# Patient Record
Sex: Female | Born: 2000
Health system: Southern US, Community
[De-identification: ages and names within clinical notes are randomized; demographics above are authoritative.]

## PROBLEM LIST (undated history)

## (undated) DIAGNOSIS — F909 Attention-deficit hyperactivity disorder, unspecified type: Secondary | ICD-10-CM

## (undated) HISTORY — DX: Attention-deficit hyperactivity disorder, unspecified type: F90.9

---
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
## 2021-06-04 DIAGNOSIS — J019 Acute sinusitis, unspecified: Secondary | ICD-10-CM | POA: Diagnosis not present

## 2022-03-01 DIAGNOSIS — F5101 Primary insomnia: Secondary | ICD-10-CM | POA: Diagnosis not present

## 2022-08-02 ENCOUNTER — Emergency Department (HOSPITAL_BASED_OUTPATIENT_CLINIC_OR_DEPARTMENT_OTHER)
Admission: EM | Admit: 2022-08-02 | Discharge: 2022-08-03 | Disposition: A | Discharge status: Home / Self Care | Payer: BC Managed Care – PPO | Attending: Emergency Medicine | Admitting: Emergency Medicine

## 2022-08-02 ENCOUNTER — Other Ambulatory Visit: Payer: Self-pay

## 2022-08-02 ENCOUNTER — Emergency Department (HOSPITAL_BASED_OUTPATIENT_CLINIC_OR_DEPARTMENT_OTHER): Payer: BC Managed Care – PPO | Admitting: Radiology

## 2022-08-02 ENCOUNTER — Encounter (HOSPITAL_BASED_OUTPATIENT_CLINIC_OR_DEPARTMENT_OTHER): Payer: Self-pay | Admitting: Emergency Medicine

## 2022-08-02 DIAGNOSIS — M545 Low back pain, unspecified: Secondary | ICD-10-CM | POA: Diagnosis not present

## 2022-08-02 DIAGNOSIS — R109 Unspecified abdominal pain: Secondary | ICD-10-CM | POA: Insufficient documentation

## 2022-08-02 DIAGNOSIS — M25552 Pain in left hip: Secondary | ICD-10-CM | POA: Diagnosis not present

## 2022-08-02 DIAGNOSIS — M25551 Pain in right hip: Secondary | ICD-10-CM | POA: Insufficient documentation

## 2022-08-02 LAB — CBC
HCT: 39.9 % (ref 36.0–46.0)
Hemoglobin: 14.2 g/dL (ref 12.0–15.0)
MCH: 31.2 pg (ref 26.0–34.0)
MCHC: 35.6 g/dL (ref 30.0–36.0)
MCV: 87.7 fL (ref 80.0–100.0)
Platelets: 256 10*3/uL (ref 150–400)
RBC: 4.55 MIL/uL (ref 3.87–5.11)
RDW: 12.3 % (ref 11.5–15.5)
WBC: 10.3 10*3/uL (ref 4.0–10.5)
nRBC: 0 % (ref 0.0–0.2)

## 2022-08-02 LAB — URINALYSIS, ROUTINE W REFLEX MICROSCOPIC
Bilirubin Urine: NEGATIVE
Glucose, UA: NEGATIVE mg/dL
Hgb urine dipstick: NEGATIVE
Ketones, ur: NEGATIVE mg/dL
Leukocytes,Ua: NEGATIVE
Nitrite: NEGATIVE
Protein, ur: NEGATIVE mg/dL
Specific Gravity, Urine: <1.005 — ABNORMAL LOW (ref 1.005–1.030)
pH: 7 (ref 5.0–8.0)

## 2022-08-02 LAB — COMPREHENSIVE METABOLIC PANEL
ALT: 10 U/L (ref 0–44)
AST: 16 U/L (ref 15–41)
Albumin: 4.6 g/dL (ref 3.5–5.0)
Alkaline Phosphatase: 67 U/L (ref 38–126)
Anion gap: 8 (ref 5–15)
BUN: 8 mg/dL (ref 6–20)
CO2: 26 mmol/L (ref 22–32)
Calcium: 9.6 mg/dL (ref 8.9–10.3)
Chloride: 104 mmol/L (ref 98–111)
Creatinine, Ser: 0.57 mg/dL (ref 0.44–1.00)
GFR, Estimated: >60 mL/min (ref >60)
Glucose, Bld: 88 mg/dL (ref 70–99)
Potassium: 3.9 mmol/L (ref 3.5–5.1)
Sodium: 138 mmol/L (ref 135–145)
Total Bilirubin: 0.3 mg/dL (ref 0.3–1.2)
Total Protein: 7.1 g/dL (ref 6.5–8.1)

## 2022-08-02 LAB — LIPASE, BLOOD: Lipase: 22 U/L (ref 11–51)

## 2022-08-02 LAB — PREGNANCY, URINE: Preg Test, Ur: NEGATIVE

## 2022-08-02 NOTE — Discharge Instructions (Signed)
You were seen here today for evaluation of your bilateral hip pain for the past few months.  Your x-rays were normal.  Your lab work was unremarkable.  I have included information for orthopedic provider to this discharge paperwork.  Please call to schedule an appointment.  Additionally, I would like for you to premedicate yourself with 600 mg of ibuprofen and 1000 mg of Tylenol before work and you can take this every 6 hours as needed for pain. I would also try some out some new footwear as well. If you have any concerns, new or worsening symptoms, please return to the nearest Emergency Department for reevaluation.  Contact a doctor if: You have pain that gets worse and does not get better with medicine. Your joint pain does not get better in 3 days. You have more bruising or swelling. You have a fever. You lose 10 lb (4.5 kg) or more without trying. Get help right away if: You cannot move the joint. Your fingers or toes tingle, become numb. or get cold and blue. You have a fever along with a joint that is red, warm, and swollen.

## 2022-08-02 NOTE — ED Triage Notes (Signed)
Lower abdominal / pelvic  pain x 4 months. Painful to walk, getting worse Sharp pain worse after walking

## 2022-08-02 NOTE — ED Provider Notes (Signed)
  [facility] Provider Note   CSN: [id number] Arrival date & time: 08/02/22  1855     History {Add pertinent medical, surgical, social history, OB history to HPI:1} Chief Complaint  Patient presents with   Abdominal Pain    Rebecca Flowers is a 22 y.o. female.   Abdominal Pain      Home Medications Prior to Admission medications   Medication Sig Start Date End Date Taking? Authorizing Provider  cloNIDine (CATAPRES) 0.1 MG tablet Take 1 tablet (0.1 mg total) by mouth at bedtime. 05/13/16   Paretta-Leahey, Haze Boyden, NP  EVEKEO 10 MG TABS Take 10 mg by mouth daily with breakfast. Do not fill until 04/02/16. 02/01/16   Paretta-Leahey, Haze Boyden, NP      Allergies    Patient has no known allergies.    Review of Systems   Review of Systems  Gastrointestinal:  Positive for abdominal pain.    Physical Exam Updated Vital Signs BP 133/73   Pulse (!) 55   Temp 98.7 F (37.1 C) (Oral)   Resp 18   LMP 07/02/2022   SpO2 99%  Physical Exam  ED Results / Procedures / Treatments   Labs (all labs ordered are listed, but only abnormal results are displayed) Labs Reviewed  URINALYSIS, ROUTINE W REFLEX MICROSCOPIC - Abnormal; Notable for the following components:      Result Value   Color, Urine COLORLESS (*)    Specific Gravity, Urine <1.005 (*)    All other components within normal limits  WET PREP, GENITAL  LIPASE, BLOOD  COMPREHENSIVE METABOLIC PANEL  CBC  PREGNANCY, URINE    EKG None  Radiology DG HIPS BILAT WITH PELVIS MIN 5 VIEWS  Result Date: 08/02/2022 CLINICAL DATA:  Sharp pain EXAM: DG HIP (WITH OR WITHOUT PELVIS) 5+V BILAT COMPARISON:  None Available. FINDINGS: There is no evidence of hip fracture or dislocation. There is no evidence of arthropathy or other focal bone abnormality. IMPRESSION: Negative. Electronically Signed   By: Donavan Foil M.D.   On: 08/02/2022 22:16    Procedures Procedures  {Document cardiac  monitor, telemetry assessment procedure when appropriate:1}  Medications Ordered in ED Medications - No data to display  ED Course/ Medical Decision Making/ A&P   {   Click here for ABCD2, HEART and other calculatorsREFRESH Note before signing :1}                          Medical Decision Making Amount and/or Complexity of Data Reviewed Labs: ordered. Radiology: ordered.   ***  {Document critical care time when appropriate:1} {Document review of labs and clinical decision tools ie heart score, Chads2Vasc2 etc:1}  {Document your independent review of radiology images, and any outside records:1} {Document your discussion with family members, caretakers, and with consultants:1} {Document social determinants of health affecting pt's care:1} {Document your decision making why or why not admission, treatments were needed:1} Final Clinical Impression(s) / ED Diagnoses Final diagnoses:  None    Rx / DC Orders ED Discharge Orders     None

## 2022-08-02 NOTE — ED Notes (Signed)
Updated patient on reason for wait

## 2022-08-03 MED ORDER — IBUPROFEN 400 MG PO TABS
600.0000 mg | ORAL_TABLET | Freq: Once | ORAL | Status: AC
  Administered 2022-08-03: 600 mg via ORAL
  Filled 2022-08-03: qty 1

## 2022-08-03 MED ORDER — ACETAMINOPHEN 500 MG PO TABS
1000.0000 mg | ORAL_TABLET | Freq: Once | ORAL | Status: AC
  Administered 2022-08-03: 1000 mg via ORAL
  Filled 2022-08-03: qty 2

## 2022-08-20 DIAGNOSIS — M25551 Pain in right hip: Secondary | ICD-10-CM | POA: Diagnosis not present

## 2022-08-20 DIAGNOSIS — M25852 Other specified joint disorders, left hip: Secondary | ICD-10-CM | POA: Diagnosis not present

## 2022-08-20 DIAGNOSIS — M25552 Pain in left hip: Secondary | ICD-10-CM | POA: Diagnosis not present

## 2022-08-28 DIAGNOSIS — M25852 Other specified joint disorders, left hip: Secondary | ICD-10-CM | POA: Diagnosis not present

## 2022-12-20 DIAGNOSIS — T7421XA Adult sexual abuse, confirmed, initial encounter: Secondary | ICD-10-CM | POA: Diagnosis not present

## 2022-12-23 DIAGNOSIS — J039 Acute tonsillitis, unspecified: Secondary | ICD-10-CM | POA: Diagnosis not present

## 2022-12-23 DIAGNOSIS — R509 Fever, unspecified: Secondary | ICD-10-CM | POA: Diagnosis not present

## 2023-01-01 DIAGNOSIS — F5101 Primary insomnia: Secondary | ICD-10-CM | POA: Diagnosis not present

## 2023-01-01 DIAGNOSIS — J020 Streptococcal pharyngitis: Secondary | ICD-10-CM | POA: Diagnosis not present

## 2023-01-01 DIAGNOSIS — F411 Generalized anxiety disorder: Secondary | ICD-10-CM | POA: Diagnosis not present

## 2023-01-01 DIAGNOSIS — N939 Abnormal uterine and vaginal bleeding, unspecified: Secondary | ICD-10-CM | POA: Diagnosis not present

## 2023-01-01 DIAGNOSIS — T7421XD Adult sexual abuse, confirmed, subsequent encounter: Secondary | ICD-10-CM | POA: Diagnosis not present

## 2023-01-15 DIAGNOSIS — J029 Acute pharyngitis, unspecified: Secondary | ICD-10-CM | POA: Diagnosis not present

## 2023-04-01 DIAGNOSIS — Z20822 Contact with and (suspected) exposure to covid-19: Secondary | ICD-10-CM | POA: Diagnosis not present

## 2023-04-01 DIAGNOSIS — J069 Acute upper respiratory infection, unspecified: Secondary | ICD-10-CM | POA: Diagnosis not present

## 2023-04-01 DIAGNOSIS — J019 Acute sinusitis, unspecified: Secondary | ICD-10-CM | POA: Diagnosis not present

## 2023-05-10 ENCOUNTER — Emergency Department (HOSPITAL_BASED_OUTPATIENT_CLINIC_OR_DEPARTMENT_OTHER)
Admission: EM | Admit: 2023-05-10 | Discharge: 2023-05-10 | Disposition: A | Discharge status: Home / Self Care | Payer: BC Managed Care – PPO | Attending: Emergency Medicine | Admitting: Emergency Medicine

## 2023-05-10 ENCOUNTER — Other Ambulatory Visit: Payer: Self-pay

## 2023-05-10 ENCOUNTER — Emergency Department (HOSPITAL_BASED_OUTPATIENT_CLINIC_OR_DEPARTMENT_OTHER): Payer: BC Managed Care – PPO

## 2023-05-10 ENCOUNTER — Encounter (HOSPITAL_BASED_OUTPATIENT_CLINIC_OR_DEPARTMENT_OTHER): Payer: Self-pay | Admitting: Emergency Medicine

## 2023-05-10 DIAGNOSIS — Y92410 Unspecified street and highway as the place of occurrence of the external cause: Secondary | ICD-10-CM | POA: Insufficient documentation

## 2023-05-10 DIAGNOSIS — S4992XA Unspecified injury of left shoulder and upper arm, initial encounter: Secondary | ICD-10-CM | POA: Diagnosis not present

## 2023-05-10 DIAGNOSIS — S0990XA Unspecified injury of head, initial encounter: Secondary | ICD-10-CM | POA: Diagnosis not present

## 2023-05-10 DIAGNOSIS — R079 Chest pain, unspecified: Secondary | ICD-10-CM | POA: Diagnosis not present

## 2023-05-10 DIAGNOSIS — S40212A Abrasion of left shoulder, initial encounter: Secondary | ICD-10-CM | POA: Insufficient documentation

## 2023-05-10 DIAGNOSIS — R55 Syncope and collapse: Secondary | ICD-10-CM | POA: Diagnosis not present

## 2023-05-10 DIAGNOSIS — R569 Unspecified convulsions: Secondary | ICD-10-CM | POA: Insufficient documentation

## 2023-05-10 DIAGNOSIS — R4020 Unspecified coma: Secondary | ICD-10-CM | POA: Diagnosis not present

## 2023-05-10 LAB — ETHANOL: Alcohol, Ethyl (B): <10 mg/dL (ref <10)

## 2023-05-10 LAB — MAGNESIUM: Magnesium: 2.1 mg/dL (ref 1.7–2.4)

## 2023-05-10 LAB — COMPREHENSIVE METABOLIC PANEL
ALT: 12 U/L (ref 0–44)
AST: 20 U/L (ref 15–41)
Albumin: 4.5 g/dL (ref 3.5–5.0)
Alkaline Phosphatase: 69 U/L (ref 38–126)
Anion gap: 9 (ref 5–15)
BUN: 9 mg/dL (ref 6–20)
CO2: 25 mmol/L (ref 22–32)
Calcium: 9.3 mg/dL (ref 8.9–10.3)
Chloride: 106 mmol/L (ref 98–111)
Creatinine, Ser: 0.62 mg/dL (ref 0.44–1.00)
GFR, Estimated: >60 mL/min (ref >60)
Glucose, Bld: 80 mg/dL (ref 70–99)
Potassium: 3.8 mmol/L (ref 3.5–5.1)
Sodium: 140 mmol/L (ref 135–145)
Total Bilirubin: 0.3 mg/dL (ref <1.2)
Total Protein: 7.1 g/dL (ref 6.5–8.1)

## 2023-05-10 LAB — URINALYSIS, W/ REFLEX TO CULTURE (INFECTION SUSPECTED)
Bilirubin Urine: NEGATIVE
Glucose, UA: NEGATIVE mg/dL
Ketones, ur: NEGATIVE mg/dL
Leukocytes,Ua: NEGATIVE
Nitrite: NEGATIVE
Protein, ur: NEGATIVE mg/dL
RBC / HPF: >50 RBC/hpf (ref 0–5)
Specific Gravity, Urine: 1.01 (ref 1.005–1.030)
pH: 5.5 (ref 5.0–8.0)

## 2023-05-10 LAB — CBC WITH DIFFERENTIAL/PLATELET
Abs Immature Granulocytes: 0.04 10*3/uL (ref 0.00–0.07)
Basophils Absolute: 0.1 10*3/uL (ref 0.0–0.1)
Basophils Relative: 1 %
Eosinophils Absolute: 0.3 10*3/uL (ref 0.0–0.5)
Eosinophils Relative: 2 %
HCT: 39.3 % (ref 36.0–46.0)
Hemoglobin: 14 g/dL (ref 12.0–15.0)
Immature Granulocytes: 0 %
Lymphocytes Relative: 19 %
Lymphs Abs: 2.2 10*3/uL (ref 0.7–4.0)
MCH: 31.3 pg (ref 26.0–34.0)
MCHC: 35.6 g/dL (ref 30.0–36.0)
MCV: 87.9 fL (ref 80.0–100.0)
Monocytes Absolute: 0.8 10*3/uL (ref 0.1–1.0)
Monocytes Relative: 7 %
Neutro Abs: 8.5 10*3/uL — ABNORMAL HIGH (ref 1.7–7.7)
Neutrophils Relative %: 71 %
Platelets: 305 10*3/uL (ref 150–400)
RBC: 4.47 MIL/uL (ref 3.87–5.11)
RDW: 12.3 % (ref 11.5–15.5)
WBC: 11.6 10*3/uL — ABNORMAL HIGH (ref 4.0–10.5)
nRBC: 0 % (ref 0.0–0.2)

## 2023-05-10 LAB — RAPID URINE DRUG SCREEN, HOSP PERFORMED
Amphetamines: NOT DETECTED
Barbiturates: NOT DETECTED
Benzodiazepines: NOT DETECTED
Cocaine: NOT DETECTED
Opiates: NOT DETECTED
Tetrahydrocannabinol: POSITIVE — AB

## 2023-05-10 LAB — HCG, SERUM, QUALITATIVE: Preg, Serum: NEGATIVE

## 2023-05-10 MED ORDER — LEVETIRACETAM 500 MG PO TABS: 500.0000 mg | ORAL_TABLET | Freq: Two times a day (BID) | ORAL | 1 refills | Status: DC

## 2023-05-10 MED ORDER — LEVETIRACETAM IN NACL 1000 MG/100ML IV SOLN
1000.0000 mg | Freq: Once | INTRAVENOUS | Status: AC
Start: 2023-05-10 — End: 2023-05-10
  Administered 2023-05-10: 1000 mg via INTRAVENOUS
  Filled 2023-05-10: qty 100

## 2023-05-10 MED ORDER — LEVETIRACETAM IN NACL 500 MG/100ML IV SOLN
500.0000 mg | Freq: Once | INTRAVENOUS | Status: AC
  Administered 2023-05-10: 500 mg via INTRAVENOUS
  Filled 2023-05-10: qty 100

## 2023-05-10 MED ORDER — SODIUM CHLORIDE 0.9 % IV SOLN: 20.0000 mg/kg | Freq: Once | INTRAVENOUS | Status: DC
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
## 2023-05-14 ENCOUNTER — Ambulatory Visit (INDEPENDENT_AMBULATORY_CARE_PROVIDER_SITE_OTHER): Payer: BC Managed Care – PPO | Admitting: Neurology

## 2023-05-14 ENCOUNTER — Encounter: Payer: Self-pay | Admitting: Neurology

## 2023-05-14 VITALS — BP 122/86 | HR 77 | Ht 63.0 in | Wt 162.5 lb

## 2023-05-14 DIAGNOSIS — R569 Unspecified convulsions: Secondary | ICD-10-CM | POA: Diagnosis not present

## 2023-05-14 DIAGNOSIS — Z5181 Encounter for therapeutic drug level monitoring: Secondary | ICD-10-CM

## 2023-05-14 DIAGNOSIS — T7421XD Adult sexual abuse, confirmed, subsequent encounter: Secondary | ICD-10-CM

## 2023-05-14 MED ORDER — LEVETIRACETAM 500 MG PO TABS: 500.0000 mg | ORAL_TABLET | Freq: Two times a day (BID) | ORAL | 3 refills | Status: DC
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
## 2023-05-16 LAB — LEVETIRACETAM LEVEL: Levetiracetam Lvl: 19.6 ug/mL (ref 10.0–40.0)

## 2023-05-19 ENCOUNTER — Telehealth: Payer: Self-pay

## 2023-05-19 NOTE — Progress Notes (Signed)
Please call and advise the patient that the recent Keppra level was within normal limits. No further action is required on these tests at this time. Please remind patient to keep any upcoming appointments or tests and to call us with any interim questions, concerns, problems or updates. Thanks,   Tonye Tancredi, MD    

## 2023-05-19 NOTE — Telephone Encounter (Signed)
Call to patient, her mother answered. Left message to return call. I added her to Dr. Teresa Coombs schedule on 08/15/23 at 9:45.   If patient returns call, please confirm she is aware of appointment and able to make it.

## 2023-05-20 ENCOUNTER — Telehealth: Payer: Self-pay | Admitting: Neurology

## 2023-05-20 NOTE — Telephone Encounter (Signed)
Yes, that is fine. Please continue the Keppra as directed.

## 2023-05-20 NOTE — Telephone Encounter (Addendum)
I talked to the patient's mom and scheduled her MRI and she said that the patient is going to the dentist tomorrow to get cavities filled and they wanted to make sure she will be ok to sit there because of her seizures. Please call her back

## 2023-05-20 NOTE — Telephone Encounter (Signed)
Returned call to mother and advised per Dr. Teresa Coombs, okay to move forward with dental appointment and continue medications as prescribed. Advised if patient wasn't feeling well or up to it, she could always reschedule dental appointment. Mother appreciative of call and verbalized  understanding

## 2023-05-23 ENCOUNTER — Other Ambulatory Visit: Payer: Self-pay

## 2023-05-23 ENCOUNTER — Emergency Department (HOSPITAL_BASED_OUTPATIENT_CLINIC_OR_DEPARTMENT_OTHER)
Admission: EM | Admit: 2023-05-23 | Discharge: 2023-05-23 | Disposition: A | Discharge status: Home / Self Care | Payer: BC Managed Care – PPO | Attending: Emergency Medicine | Admitting: Emergency Medicine

## 2023-05-23 ENCOUNTER — Telehealth: Payer: Self-pay | Admitting: Neurology

## 2023-05-23 ENCOUNTER — Encounter (HOSPITAL_BASED_OUTPATIENT_CLINIC_OR_DEPARTMENT_OTHER): Payer: Self-pay

## 2023-05-23 ENCOUNTER — Other Ambulatory Visit (HOSPITAL_BASED_OUTPATIENT_CLINIC_OR_DEPARTMENT_OTHER): Payer: Self-pay

## 2023-05-23 DIAGNOSIS — R112 Nausea with vomiting, unspecified: Secondary | ICD-10-CM | POA: Diagnosis not present

## 2023-05-23 DIAGNOSIS — R1013 Epigastric pain: Secondary | ICD-10-CM | POA: Diagnosis not present

## 2023-05-23 DIAGNOSIS — D72829 Elevated white blood cell count, unspecified: Secondary | ICD-10-CM | POA: Insufficient documentation

## 2023-05-23 LAB — COMPREHENSIVE METABOLIC PANEL
ALT: 13 U/L (ref 0–44)
AST: 19 U/L (ref 15–41)
Albumin: 4.8 g/dL (ref 3.5–5.0)
Alkaline Phosphatase: 75 U/L (ref 38–126)
Anion gap: 11 (ref 5–15)
BUN: 15 mg/dL (ref 6–20)
CO2: 27 mmol/L (ref 22–32)
Calcium: 9.1 mg/dL (ref 8.9–10.3)
Chloride: 105 mmol/L (ref 98–111)
Creatinine, Ser: 0.67 mg/dL (ref 0.44–1.00)
GFR, Estimated: >60 mL/min (ref >60)
Glucose, Bld: 113 mg/dL — ABNORMAL HIGH (ref 70–99)
Potassium: 3.8 mmol/L (ref 3.5–5.1)
Sodium: 143 mmol/L (ref 135–145)
Total Bilirubin: 0.6 mg/dL (ref <1.2)
Total Protein: 8 g/dL (ref 6.5–8.1)

## 2023-05-23 LAB — URINALYSIS, ROUTINE W REFLEX MICROSCOPIC
Bilirubin Urine: NEGATIVE
Glucose, UA: NEGATIVE mg/dL
Hgb urine dipstick: NEGATIVE
Ketones, ur: NEGATIVE mg/dL
Leukocytes,Ua: NEGATIVE
Nitrite: NEGATIVE
Specific Gravity, Urine: 1.028 (ref 1.005–1.030)
pH: 7 (ref 5.0–8.0)

## 2023-05-23 LAB — CBC
HCT: 43.9 % (ref 36.0–46.0)
Hemoglobin: 15 g/dL (ref 12.0–15.0)
MCH: 30.8 pg (ref 26.0–34.0)
MCHC: 34.2 g/dL (ref 30.0–36.0)
MCV: 90.1 fL (ref 80.0–100.0)
Platelets: 260 10*3/uL (ref 150–400)
RBC: 4.87 MIL/uL (ref 3.87–5.11)
RDW: 12.6 % (ref 11.5–15.5)
WBC: 11.6 10*3/uL — ABNORMAL HIGH (ref 4.0–10.5)
nRBC: 0 % (ref 0.0–0.2)

## 2023-05-23 LAB — LIPASE, BLOOD: Lipase: 11 U/L (ref 11–51)

## 2023-05-23 LAB — PREGNANCY, URINE: Preg Test, Ur: NEGATIVE

## 2023-05-23 MED ORDER — ONDANSETRON 4 MG PO TBDP
4.0000 mg | ORAL_TABLET | Freq: Once | ORAL | Status: AC | PRN
  Administered 2023-05-23: 4 mg via ORAL
  Filled 2023-05-23: qty 1

## 2023-05-23 MED ORDER — LEVETIRACETAM 500 MG PO TABS
500.0000 mg | ORAL_TABLET | Freq: Once | ORAL | Status: AC
  Administered 2023-05-23: 500 mg via ORAL
  Filled 2023-05-23: qty 1

## 2023-05-23 MED ORDER — ONDANSETRON 4 MG PO TBDP
4.0000 mg | ORAL_TABLET | Freq: Three times a day (TID) | ORAL | 0 refills | Status: DC | PRN
  Filled 2023-05-23: qty 20, 7d supply, fill #0

## 2023-05-23 NOTE — ED Provider Notes (Signed)
[facility] EMERGENCY DEPARTMENT AT [facility] Provider Note   CSN: [id number] Arrival date & time: 05/23/23  1105     History  Chief Complaint  Patient presents with   Emesis    Rebecca Flowers is a 22 y.o. female.   Emesis   22 year old female presents to the emergency department with complaints of nausea, vomiting, abdominal pain.  Patient reports recently being started Keppra last week.  States that she drank several cocktails last night 1 containing THC.  States that she woke up around 4 AM this morning with feelings of nausea and multiple multiple episodes of vomiting.  Reports cramping diffuse abdominal pain during bouts of emesis.  Was given Zofran in the triage area and noted resolution of symptoms.  Denies any fever, hematemesis, urinary symptoms, vaginal symptoms, change in bowel habits, chest pain, shortness of breath.  Past medical history significant for seizure  Home Medications Prior to Admission medications   Medication Sig Start Date End Date Taking? Authorizing Provider  ondansetron (ZOFRAN-ODT) 4 MG disintegrating tablet Take 1 tablet (4 mg total) by mouth every 8 (eight) hours as needed. 05/23/23  Yes Sherian Maroon A, PA  cloNIDine (CATAPRES) 0.1 MG tablet Take 1 tablet (0.1 mg total) by mouth at bedtime. 05/13/16   Paretta-Leahey, Miachel Roux, NP  EVEKEO 10 MG TABS Take 10 mg by mouth daily with breakfast. Do not fill until 04/02/16. Patient not taking: Reported on 05/14/2023 02/01/16   Paretta-Leahey, Miachel Roux, NP  levETIRAcetam (KEPPRA) 500 MG tablet Take 1 tablet (500 mg total) by mouth 2 (two) times daily. 05/14/23 05/08/24  Windell Norfolk, MD      Allergies    Patient has no known allergies.    Review of Systems   Review of Systems  Gastrointestinal:  Positive for vomiting.  All other systems reviewed and are negative.   Physical Exam Updated Vital Signs BP 125/72   Pulse 97   Temp 98.9 F (37.2 C)   Resp 16   LMP 05/09/2023 (Approximate)    SpO2 100%  Physical Exam Vitals and nursing note reviewed.  Constitutional:      General: She is not in acute distress.    Appearance: She is well-developed.  HENT:     Head: Normocephalic and atraumatic.  Eyes:     Conjunctiva/sclera: Conjunctivae normal.  Cardiovascular:     Rate and Rhythm: Normal rate and regular rhythm.     Heart sounds: No murmur heard. Pulmonary:     Effort: Pulmonary effort is normal. No respiratory distress.     Breath sounds: Normal breath sounds.  Abdominal:     Palpations: Abdomen is soft.     Comments: Mild epigastric tenderness  Musculoskeletal:        General: No swelling.     Cervical back: Neck supple.  Skin:    General: Skin is warm and dry.     Capillary Refill: Capillary refill takes less than 2 seconds.  Neurological:     Mental Status: She is alert.  Psychiatric:        Mood and Affect: Mood normal.     ED Results / Procedures / Treatments   Labs (all labs ordered are listed, but only abnormal results are displayed) Labs Reviewed  COMPREHENSIVE METABOLIC PANEL - Abnormal; Notable for the following components:      Result Value   Glucose, Bld 113 (*)    All other components within normal limits  CBC - Abnormal; Notable for the following components:  WBC 11.6 (*)    All other components within normal limits  URINALYSIS, ROUTINE W REFLEX MICROSCOPIC - Abnormal; Notable for the following components:   Protein, ur TRACE (*)    All other components within normal limits  LIPASE, BLOOD  PREGNANCY, URINE    EKG None  Radiology No results found.  Procedures Procedures    Medications Ordered in ED Medications  ondansetron (ZOFRAN-ODT) disintegrating tablet 4 mg (4 mg Oral Given 05/23/23 1124)  levETIRAcetam (KEPPRA) tablet 500 mg (500 mg Oral Given 05/23/23 1255)    ED Course/ Medical Decision Making/ A&P                                 Medical Decision Making Amount and/or Complexity of Data Reviewed Labs:  ordered.  Risk Prescription drug management.   This patient presents to the ED for concern of nausea, vomiting, this involves an extensive number of treatment options, and is a complaint that carries with it a high risk of complications and morbidity.  The differential diagnosis includes hyperemesis cannabinoid syndrome, gastritis, PUD, CBD pathology, cholecystitis, SBO/LBO, volvulus, diverticulitis, sinusitis, plantar fasciitis, nephrolithiasis, cystitis, ectopic pregnancy, ovarian torsion, viral gastroenteritis, colitis, other   Co morbidities that complicate the patient evaluation  See HPI   Additional history obtained:  Additional history obtained from EMR External records from outside source obtained and reviewed including hospital records   Lab Tests:  I Ordered, and personally interpreted labs.  The pertinent results include: Slight leukocytosis of 11.6.  No evidence of anemia.  Placed within range.  No Electra abnormalities.  No transaminitis.  No renal dysfunction.  UA with trace proteins but otherwise unremarkable.  Urine pregnancy negative.   Imaging Studies ordered:  N/a   Cardiac Monitoring: / EKG:  The patient was maintained on a cardiac monitor.  I personally viewed and interpreted the cardiac monitored which showed an underlying rhythm of: sinus rhythm   Consultations Obtained:  N/a   Problem List / ED Course / Critical interventions / Medication management  Nausea, vomiting I ordered medication including Zofran, Keppra   Reevaluation of the patient after these medicines showed that the patient improved I have reviewed the patients home medicines and have made adjustments as needed   Social Determinants of Health:  Marijuana use.  Binge alcohol use.   Test / Admission - Considered:  Nausea, vomiting Vitals signs within normal range and stable throughout visit. Laboratory studies significant for: See above 22 year old female presents emergency  department with complaints of nausea, vomiting, generalized abdominal pain associated with bouts of emesis.  Per patient, had several cocktails last night 1 containing THC when awakening this morning around 4 AM with multiple episodes of emesis.  Initially upon presentation, patient had already received Zofran in the triage area and noted near resolution of symptoms.  Patient with mild epigastric tenderness to palpation.  Otherwise, nontender abdomen.  Labs reassuring.  Shared decision made conversation was had with patient regarding lack of clinical support for obtaining CT imaging given patient's pain isolated in epigastric region as well as near resolution of symptoms with administration of Zofran.  Low suspicion for pancreatitis given lipase within normal limits.  Low suspicion for CBD pathology given lack of right upper quadrant abdominal pain, normal hepatic function, alkaline phosphatase and bilirubin.  Suspect the patient's symptoms are likely secondary to [facility] versus/and commendation with excessive alcohol intake yesterday.  Will recommend bland diet as well  as antiemetic as needed and follow-up with primary care in the outpatient setting.  Treatment plan discussed at length with patient and she acknowledged understanding was agreeable to said plan.  Patient overall well-appearing, afebrile in no acute distress. Worrisome signs and symptoms were discussed with the patient, and the patient acknowledged understanding to return to the ED if noticed. Patient was stable upon discharge.          Final Clinical Impression(s) / ED Diagnoses Final diagnoses:  Nausea and vomiting, unspecified vomiting type    Rx / DC Orders ED Discharge Orders          Ordered    ondansetron (ZOFRAN-ODT) 4 MG disintegrating tablet  Every 8 hours PRN        05/23/23 1249              Peter Garter, [place] 05/23/23 1731    Melene Plan, DO 05/26/23 0800

## 2023-05-23 NOTE — Telephone Encounter (Signed)
Pt's mother called, pt drink alcohol last night and today 4 am this morning have been throwing up. She can not hold anything down.  Mother would like a call back to discuss if alcohol had interaction with the seizure medication. Informed mother pt should go to the emergency room.

## 2023-05-23 NOTE — ED Notes (Signed)
Pt provided with fluids for PO challenge at this time. Will monitor.

## 2023-05-23 NOTE — ED Triage Notes (Signed)
Pt started on new seizure medication yesterday, and had several cocktails last night, awoke at 4am today with multiple episodes of vomiting.  Denies any seizure activity, LOC or abdominal pain.  Reports chills.

## 2023-05-23 NOTE — Discharge Instructions (Signed)
As discussed, labs reassuring.  Will send you home with Zofran to take as needed for nausea and vomiting.  Recommend bland diet until you are able to tolerate more complex foods.  Please not hesitate to return if abdominal pain returns/worsens, development of fever, intractable nausea vomiting.  Otherwise, recommend avoidance of consumption of marijuana containing products as well as limit alcohol intake.

## 2023-05-23 NOTE — ED Notes (Signed)
Pt able to tolerate oral intake. Denies nausea at this time. No episodes of emesis.

## 2023-05-23 NOTE — Telephone Encounter (Signed)
Agree to ED if needed now, has EEG for Jan 7th 2025, and follow up with Dr. Teresa Coombs in Feb 2025

## 2023-05-23 NOTE — ED Notes (Signed)
Pt alert and oriented X 4 at the time of discharge. RR even and unlabored. No acute distress noted. Pt verbalized understanding of discharge instructions as discussed. Pt ambulatory to lobby at time of discharge.

## 2023-05-26 NOTE — Telephone Encounter (Signed)
Pt's mother called back to speak to RN. Please call back when available.

## 2023-05-26 NOTE — Telephone Encounter (Signed)
Thanks

## 2023-05-26 NOTE — Telephone Encounter (Signed)
Call to patients mother, no answer. Left message to return call

## 2023-05-26 NOTE — Telephone Encounter (Signed)
Returned call to mother who reports patient is doing much better. She has not  had any recent seizures and unsure where the sudden onset of  nausea and vomiting came from. She reports medications compliance.I reviewed with her seizure triggers and avoiding drug and alcohol use. Mother verbalized understanding appreciative of call.

## 2023-05-27 ENCOUNTER — Ambulatory Visit (INDEPENDENT_AMBULATORY_CARE_PROVIDER_SITE_OTHER): Payer: BC Managed Care – PPO

## 2023-05-27 DIAGNOSIS — R569 Unspecified convulsions: Secondary | ICD-10-CM | POA: Diagnosis not present

## 2023-05-27 MED ORDER — GADOBENATE DIMEGLUMINE 529 MG/ML IV SOLN
15.0000 mL | Freq: Once | INTRAVENOUS | Status: AC | PRN
  Administered 2023-05-27: 15 mL via INTRAVENOUS

## 2023-05-28 NOTE — Progress Notes (Signed)
Please call and inform patient that her recent MRI Brain did not show any acute abnormalities, such as a stroke, or mass or blood products. 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 No further action is required on this test at this time. Please keep any upcoming appointments or tests and  call us with any interim questions, concerns, problems or updates. Thanks,   Windell Norfolk, MD

## 2023-06-24 ENCOUNTER — Encounter: Payer: Self-pay | Admitting: Neurology

## 2023-06-24 ENCOUNTER — Ambulatory Visit (INDEPENDENT_AMBULATORY_CARE_PROVIDER_SITE_OTHER): Payer: BC Managed Care – PPO | Admitting: Neurology

## 2023-06-24 DIAGNOSIS — R569 Unspecified convulsions: Secondary | ICD-10-CM

## 2023-06-25 ENCOUNTER — Telehealth: Payer: Self-pay

## 2023-06-25 NOTE — Procedures (Signed)
    History:  23 year old woman with seizure like activity   EEG classification: Awake and drowsy  Duration: 30 minutes   Technical aspects: This EEG study was done with scalp electrodes positioned according to the 10-20 International system of electrode placement. Electrical activity was reviewed with band pass filter of 1-70Hz , sensitivity of 7 uV/mm, display speed of 15mm/sec with a 60Hz  notched filter applied as appropriate. EEG data were recorded continuously and digitally stored.   Description of the recording: The background rhythms of this recording consists of a fairly well modulated medium amplitude alpha rhythm of 11 Hz that is reactive to eye opening and closure. Present in the anterior head region is a 15-20 Hz beta activity. Photic stimulation was performed, did not show any abnormalities. Hyperventilation was also performed, did not show any abnormalities. Drowsiness was manifested by background fragmentation. No abnormal epileptiform discharges seen during this recording. There was no focal slowing. There were no electrographic seizure identified. There was presence of breach rhythm in the right parietal region.   Abnormality: None   Impression: This is a normal EEG recorded while drowsy and awake. No evidence of interictal epileptiform discharges, there was presence of breach rhythm in the right parietal region. Normal EEGs, however, do not rule out epilepsy.    Shanoah Asbill, MD [facility]

## 2023-06-25 NOTE — Telephone Encounter (Signed)
 Call to patient regarding my chart message and recent seizures. Mom answered and stated she was sleeping and would have her call back when she wakes up.

## 2023-07-22 ENCOUNTER — Telehealth: Payer: Self-pay | Admitting: Neurology

## 2023-07-22 DIAGNOSIS — J0100 Acute maxillary sinusitis, unspecified: Secondary | ICD-10-CM | POA: Diagnosis not present

## 2023-07-22 NOTE — Telephone Encounter (Signed)
 Received voicemail from patient's mother Avani Sensabaugh. States I think she has an appointment in February, not sure what date she is scheduled for. But I have some concerns about her and would like to speak with the doctor. Mother states patient has been drinking alcohol, has been drinking Vodka with her seizure medications and she is very concerned. Mother would like a call back to discuss [phone]. She is on DPR. I called patients mother back to confirm appt and let her know I am sending message back to our clinical staff to review and advise, she is expecting a call back.

## 2023-07-22 NOTE — Telephone Encounter (Signed)
Called and spoke to pts mother and stated that we advise not to drink alcohol with sz meds but ultimately its pts decision. Pt mother voiced understanding and gratitude.

## 2023-08-14 DIAGNOSIS — F4310 Post-traumatic stress disorder, unspecified: Secondary | ICD-10-CM | POA: Diagnosis not present

## 2023-08-14 DIAGNOSIS — F411 Generalized anxiety disorder: Secondary | ICD-10-CM | POA: Diagnosis not present

## 2023-08-14 DIAGNOSIS — F321 Major depressive disorder, single episode, moderate: Secondary | ICD-10-CM | POA: Diagnosis not present

## 2023-08-15 ENCOUNTER — Ambulatory Visit: Payer: BC Managed Care – PPO | Admitting: Neurology

## 2023-08-15 ENCOUNTER — Encounter: Payer: Self-pay | Admitting: Neurology

## 2023-08-15 VITALS — BP 141/82 | HR 71 | Ht 63.0 in | Wt 169.0 lb

## 2023-08-15 DIAGNOSIS — T7421XD Adult sexual abuse, confirmed, subsequent encounter: Secondary | ICD-10-CM

## 2023-08-15 DIAGNOSIS — R569 Unspecified convulsions: Secondary | ICD-10-CM

## 2023-08-15 MED ORDER — LEVETIRACETAM 500 MG PO TABS: 500.0000 mg | ORAL_TABLET | Freq: Every evening | ORAL | 0 refills | Status: DC
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
## 2023-08-15 NOTE — Patient Instructions (Addendum)
 Decrease Keppra to 500 mg nightly Continue with all your other medications Continue with therapy Follow-up in 3 months or sooner if worse.

## 2023-08-21 DIAGNOSIS — F321 Major depressive disorder, single episode, moderate: Secondary | ICD-10-CM | POA: Diagnosis not present

## 2023-08-21 DIAGNOSIS — F411 Generalized anxiety disorder: Secondary | ICD-10-CM | POA: Diagnosis not present

## 2023-08-21 DIAGNOSIS — F4310 Post-traumatic stress disorder, unspecified: Secondary | ICD-10-CM | POA: Diagnosis not present

## 2023-08-26 DIAGNOSIS — F321 Major depressive disorder, single episode, moderate: Secondary | ICD-10-CM | POA: Diagnosis not present

## 2023-08-26 DIAGNOSIS — F411 Generalized anxiety disorder: Secondary | ICD-10-CM | POA: Diagnosis not present

## 2023-08-26 DIAGNOSIS — F4310 Post-traumatic stress disorder, unspecified: Secondary | ICD-10-CM | POA: Diagnosis not present

## 2023-09-04 DIAGNOSIS — F4310 Post-traumatic stress disorder, unspecified: Secondary | ICD-10-CM | POA: Diagnosis not present

## 2023-09-04 DIAGNOSIS — F411 Generalized anxiety disorder: Secondary | ICD-10-CM | POA: Diagnosis not present

## 2023-09-04 DIAGNOSIS — F321 Major depressive disorder, single episode, moderate: Secondary | ICD-10-CM | POA: Diagnosis not present

## 2023-09-18 DIAGNOSIS — F411 Generalized anxiety disorder: Secondary | ICD-10-CM | POA: Diagnosis not present

## 2023-09-18 DIAGNOSIS — F4310 Post-traumatic stress disorder, unspecified: Secondary | ICD-10-CM | POA: Diagnosis not present

## 2023-09-18 DIAGNOSIS — F321 Major depressive disorder, single episode, moderate: Secondary | ICD-10-CM | POA: Diagnosis not present

## 2023-09-30 DIAGNOSIS — F411 Generalized anxiety disorder: Secondary | ICD-10-CM | POA: Diagnosis not present

## 2023-09-30 DIAGNOSIS — F321 Major depressive disorder, single episode, moderate: Secondary | ICD-10-CM | POA: Diagnosis not present

## 2023-09-30 DIAGNOSIS — F4310 Post-traumatic stress disorder, unspecified: Secondary | ICD-10-CM | POA: Diagnosis not present

## 2023-10-16 DIAGNOSIS — F411 Generalized anxiety disorder: Secondary | ICD-10-CM | POA: Diagnosis not present

## 2023-10-16 DIAGNOSIS — F321 Major depressive disorder, single episode, moderate: Secondary | ICD-10-CM | POA: Diagnosis not present

## 2023-10-16 DIAGNOSIS — F4310 Post-traumatic stress disorder, unspecified: Secondary | ICD-10-CM | POA: Diagnosis not present

## 2023-11-21 DIAGNOSIS — J069 Acute upper respiratory infection, unspecified: Secondary | ICD-10-CM | POA: Diagnosis not present

## 2023-12-17 ENCOUNTER — Encounter: Payer: Self-pay | Admitting: Neurology

## 2023-12-17 ENCOUNTER — Ambulatory Visit (INDEPENDENT_AMBULATORY_CARE_PROVIDER_SITE_OTHER): Payer: BC Managed Care – PPO | Admitting: Neurology

## 2023-12-17 VITALS — BP 136/87 | HR 58 | Resp 15 | Ht 63.0 in | Wt 169.5 lb

## 2023-12-17 DIAGNOSIS — F439 Reaction to severe stress, unspecified: Secondary | ICD-10-CM | POA: Diagnosis not present

## 2023-12-17 DIAGNOSIS — R569 Unspecified convulsions: Secondary | ICD-10-CM

## 2023-12-17 DIAGNOSIS — T7421XD Adult sexual abuse, confirmed, subsequent encounter: Secondary | ICD-10-CM

## 2023-12-17 MED ORDER — LAMOTRIGINE 100 MG PO TABS: 100.0000 mg | ORAL_TABLET | Freq: Every day | ORAL | 3 refills | Status: AC

## 2023-12-17 MED ORDER — LAMOTRIGINE 25 MG PO TABS: ORAL_TABLET | ORAL | 0 refills | Status: AC
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
## 2023-12-17 NOTE — Patient Instructions (Signed)
 Decrease Keppra  to 250 mg daily for 2 weeks then stop the medication Start lamotrigine 25 mg daily for 1 week, then increase to 50 mg daily for 1 week, then further increase to 75 mg daily for 1 week, and finally increase to 100 mg daily for 1 week, then continue with 100 mg daily.   New referral to psychology in [place] Continue to follow with PCP Return in 6 months or sooner if worse.

## 2023-12-18 ENCOUNTER — Telehealth: Payer: Self-pay | Admitting: Neurology

## 2023-12-18 NOTE — Telephone Encounter (Signed)
 Referral for Psychology faxed to [facility]  Phone [phone] Fax [phone]

## 2024-01-16 DIAGNOSIS — F5101 Primary insomnia: Secondary | ICD-10-CM | POA: Diagnosis not present

## 2024-01-16 DIAGNOSIS — Z23 Encounter for immunization: Secondary | ICD-10-CM | POA: Diagnosis not present

## 2024-01-16 DIAGNOSIS — G40909 Epilepsy, unspecified, not intractable, without status epilepticus: Secondary | ICD-10-CM | POA: Diagnosis not present

## 2024-05-26 ENCOUNTER — Other Ambulatory Visit: Payer: Self-pay

## 2024-05-26 ENCOUNTER — Emergency Department (HOSPITAL_BASED_OUTPATIENT_CLINIC_OR_DEPARTMENT_OTHER)
Admission: EM | Admit: 2024-05-26 | Discharge: 2024-05-26 | Disposition: A | Discharge status: Home / Self Care | Attending: Emergency Medicine | Admitting: Emergency Medicine

## 2024-05-26 ENCOUNTER — Encounter (HOSPITAL_BASED_OUTPATIENT_CLINIC_OR_DEPARTMENT_OTHER): Payer: Self-pay | Admitting: Emergency Medicine

## 2024-05-26 DIAGNOSIS — R1032 Left lower quadrant pain: Secondary | ICD-10-CM | POA: Insufficient documentation

## 2024-05-26 DIAGNOSIS — R399 Unspecified symptoms and signs involving the genitourinary system: Secondary | ICD-10-CM | POA: Diagnosis not present

## 2024-05-26 DIAGNOSIS — R198 Other specified symptoms and signs involving the digestive system and abdomen: Secondary | ICD-10-CM | POA: Diagnosis not present

## 2024-05-26 DIAGNOSIS — R1030 Lower abdominal pain, unspecified: Secondary | ICD-10-CM | POA: Diagnosis present

## 2024-05-26 DIAGNOSIS — K5909 Other constipation: Secondary | ICD-10-CM | POA: Diagnosis not present

## 2024-05-26 DIAGNOSIS — R1031 Right lower quadrant pain: Secondary | ICD-10-CM | POA: Insufficient documentation

## 2024-05-26 LAB — COMPREHENSIVE METABOLIC PANEL WITH GFR
ALT: 10 U/L (ref 0–44)
AST: 18 U/L (ref 15–41)
Albumin: 4.4 g/dL (ref 3.5–5.0)
Alkaline Phosphatase: 83 U/L (ref 38–126)
Anion gap: 9 (ref 5–15)
BUN: 8 mg/dL (ref 6–20)
CO2: 27 mmol/L (ref 22–32)
Calcium: 10 mg/dL (ref 8.9–10.3)
Chloride: 103 mmol/L (ref 98–111)
Creatinine, Ser: 0.57 mg/dL (ref 0.44–1.00)
GFR, Estimated: >60 mL/min (ref >60)
Glucose, Bld: 85 mg/dL (ref 70–99)
Potassium: 4.5 mmol/L (ref 3.5–5.1)
Sodium: 139 mmol/L (ref 135–145)
Total Bilirubin: 0.3 mg/dL (ref 0.0–1.2)
Total Protein: 7 g/dL (ref 6.5–8.1)

## 2024-05-26 LAB — URINALYSIS, ROUTINE W REFLEX MICROSCOPIC
Bilirubin Urine: NEGATIVE
Glucose, UA: NEGATIVE mg/dL
Hgb urine dipstick: NEGATIVE
Ketones, ur: NEGATIVE mg/dL
Leukocytes,Ua: NEGATIVE
Nitrite: NEGATIVE
Protein, ur: NEGATIVE mg/dL
Specific Gravity, Urine: 1.01 (ref 1.005–1.030)
pH: 6 (ref 5.0–8.0)

## 2024-05-26 LAB — CBC
HCT: 37.8 % (ref 36.0–46.0)
Hemoglobin: 13.2 g/dL (ref 12.0–15.0)
MCH: 31.2 pg (ref 26.0–34.0)
MCHC: 34.9 g/dL (ref 30.0–36.0)
MCV: 89.4 fL (ref 80.0–100.0)
Platelets: 201 K/uL (ref 150–400)
RBC: 4.23 MIL/uL (ref 3.87–5.11)
RDW: 12.8 % (ref 11.5–15.5)
WBC: 8.1 K/uL (ref 4.0–10.5)
nRBC: 0 % (ref 0.0–0.2)

## 2024-05-26 LAB — LIPASE, BLOOD: Lipase: 29 U/L (ref 11–51)

## 2024-05-26 LAB — PREGNANCY, URINE: Preg Test, Ur: NEGATIVE

## 2024-05-26 NOTE — Discharge Instructions (Addendum)
 Your workup was reassuring here.  Your kidney, liver, gallbladder, and pancreas labs are normal.  Your urine did not show any signs of infection.  Your pregnancy test is negative.  Your electrolytes and blood counts were normal.  Constipation may be causing your abdominal pain.  Drink plenty of fluids at home and eat a fiber fillet diet (lots of fruits and vegetables). Please engage in daily exercise as this also helps to maintain regular bowel movements.   Try to use the bathroom after eating a meal. Place your feet up on a small stepstool when trying to have a bowel movement.  You may take 1-3 scoop(s) of Miralax in the morning until you have normal bowel movement where you are not straining.  This helps to soften the stool.  You may also take Ducolax (Bisacodyl) 2 tablets (10mg ) once daily in the morning.  This helps to stimulate the bowel.  These are medications you can get over the counter at any drugstore.   Please return to the emergency room if you develop worsening abdominal pain, persistent vomiting, fevers, any other new or concerning symptoms.

## 2024-05-26 NOTE — ED Triage Notes (Signed)
 [facility] with urinary symptoms and lower abdominal pain. Urinary retention and pain are her main symptoms. She had urinalysis at [facility] and was told her urine was normal and that she needed imaging. Pt denies n/v/d. Denies sob/cp. Pt a&o x 4; nad noted.

## 2024-05-26 NOTE — ED Provider Notes (Addendum)
 [place] EMERGENCY DEPARTMENT AT [facility] Provider Note   CSN: [id number] Arrival date & time: 05/26/24  1526     Patient presents with: Abdominal Pain   Rebecca Flowers is a 23 y.o. female with history of seizures, ADHD, presents with concern for lower abdominal pain that started last night.  She reports that pain came on fairly suddenly and stretched across her entire lower abdomen.  Since then, the pain has been getting better, but still present.  Denies any associated nausea, vomiting, fever, or chills.  Reports she is having bowel movements, but having to strain and having some pain when passing a bowel movement.  Denies any abnormal vaginal discharge.  Denies any dysuria, hematuria, increased frequency.  Denies any flank pain.  Reports she seen at urgent care and told to come here for further evaluation.    Abdominal Pain      Prior to Admission medications   Medication Sig Start Date End Date Taking? Authorizing Provider  cloNIDine  (CATAPRES ) 0.1 MG tablet Take 1 tablet (0.1 mg total) by mouth at bedtime. 05/13/16   Paretta-Leahey, Stephane HERO, NP  lamoTRIgine  (LAMICTAL ) 100 MG tablet Take 1 tablet (100 mg total) by mouth daily. 12/17/23 12/11/24  Camara, Amadou, MD  lamoTRIgine  (LAMICTAL ) 25 MG tablet Take 1 tablet (25 mg total) by mouth daily for 7 days, THEN 2 tablets (50 mg total) daily for 7 days, THEN 3 tablets (75 mg total) daily for 7 days, THEN 4 tablets (100 mg total) daily for 7 days. 12/17/23 01/14/24  Camara, Amadou, MD    Allergies: Patient has no known allergies.    Review of Systems  Gastrointestinal:  Positive for abdominal pain.    Updated Vital Signs BP (!) 153/111   Pulse 82   Temp 98.7 F (37.1 C)   Resp 20   Ht 5' 3 (1.6 m)   Wt 76.9 kg   LMP 05/05/2024 (Approximate)   SpO2 100%   BMI 30.03 kg/m   Physical Exam Vitals and nursing note reviewed.  Constitutional:      General: She is not in acute distress.    Appearance: She is  well-developed.  HENT:     Head: Normocephalic and atraumatic.  Eyes:     Conjunctiva/sclera: Conjunctivae normal.  Cardiovascular:     Rate and Rhythm: Normal rate and regular rhythm.     Heart sounds: No murmur heard. Pulmonary:     Effort: Pulmonary effort is normal. No respiratory distress.     Breath sounds: Normal breath sounds.  Abdominal:     Palpations: Abdomen is soft.     Tenderness: There is abdominal tenderness.     Comments: Mild abdominal tenderness in the right lower quadrant, suprapubic region, and left lower quadrant to palpation without rebound or guarding  Musculoskeletal:        General: No swelling.     Cervical back: Neck supple.  Skin:    General: Skin is warm and dry.     Capillary Refill: Capillary refill takes less than 2 seconds.  Neurological:     Mental Status: She is alert.  Psychiatric:        Mood and Affect: Mood normal.     (all labs ordered are listed, but only abnormal results are displayed) Labs Reviewed  URINALYSIS, ROUTINE W REFLEX MICROSCOPIC - Abnormal; Notable for the following components:      Result Value   Color, Urine COLORLESS (*)    All other components within normal limits  LIPASE, BLOOD  COMPREHENSIVE METABOLIC PANEL WITH GFR  CBC  PREGNANCY, URINE    EKG: None  Radiology: No results found.   Procedures   Medications Ordered in the ED - No data to display                                  Medical Decision Making Amount and/or Complexity of Data Reviewed Labs: ordered.     Differential diagnosis includes but is not limited to  acute cholecystitis, cholelithiasis, cholangitis, choledocholithiasis, peptic ulcer, gastritis, gastroenteritis, appendicitis, IBS, IBD, DKA, nephrolithiasis, UTI, pyelonephritis, pancreatitis, diverticulitis, mesenteric ischemia, abdominal aortic aneurysm, small bowel obstruction, volvulus, ovarian torsion and pregnancy related concerns in females of childbearing age    ED  Course:  Upon initial evaluation, patient is very well-appearing, vital signs aside from her elevated blood pressure 153/111 upon arrival.  Reporting lower abdominal pain, but on exam, only minimal abdominal tenderness in the lower abdomen diffusely.  No rebound or guarding.  No CVA tenderness bilaterally.  Patient declines any pain or nausea medications currently.  Labs Ordered: I Ordered, and personally interpreted labs.  The pertinent results include:   CBC without leukocytosis, within normal limits CMP within normal limits.  No elevations in LFTs, creatinine, no electrolyte abnormalities. Lipase within normal limits Urinalysis without signs of infection Pregnancy test negative  Medications Given: None  Upon re-evaluation, patient remains well-appearing with stable vitals.  I have very low concern for acute intra-abdominal pathology such as appendicitis, diverticulitis, ovarian torsion given patient not significantly tender to palpation on exam, pain improving since onset yesterday.  She has reassuring labs, CBC without leukocytosis.  No elevations in LFTs, creatinine, lipase.  Denies any abnormal vaginal discharge or concern for STIs, low concern for STI, BV, yeast infection.  Given report of having to strain with bowel movements and pain with her bowel movements, question if she may have constipation contributing to her abdominal pain.  She did have a bowel movement this morning, no vomiting, low concern for SBO.  We will trial a bowel regimen at home to see if this helps to improve her pain.  Very strict return precautions given.  Patient stable and appropriate for discharge home.    Impression: Lower abdominal pain Constipation  Disposition:  The patient was discharged home with instructions to take 1-3 scoops of MiraLAX daily to help maintain normal bowel movements.  Increase fiber and water intake.  May also take Bisacodyl if needed to help with bowel movements. Return precautions  given and patient verbalized understanding.    This chart was dictated using voice recognition software, Dragon. Despite the best efforts of this provider to proofread and correct errors, errors may still occur which can change documentation meaning.       Final diagnoses:  Lower abdominal pain  Other constipation    ED Discharge Orders     None          Veta Palma, PA-C 05/26/24 1908    Veta Palma, PA-C 05/26/24 1908    Emil Share, DO 05/26/24 1921

## 2024-08-05 ENCOUNTER — Telehealth: Admitting: Neurology
